# Patient Record
Sex: Male | Born: 1950 | Race: Black or African American | Hispanic: No
Health system: Southern US, Community
[De-identification: ages and names within clinical notes are randomized; demographics above are authoritative.]

## PROBLEM LIST (undated history)

## (undated) DIAGNOSIS — C61 Malignant neoplasm of prostate: Secondary | ICD-10-CM

## (undated) DIAGNOSIS — H9190 Unspecified hearing loss, unspecified ear: Secondary | ICD-10-CM

## (undated) DIAGNOSIS — R569 Unspecified convulsions: Secondary | ICD-10-CM

## (undated) HISTORY — PX: PROSTATE SURGERY: SHX751

---

## 2020-07-12 ENCOUNTER — Other Ambulatory Visit: Payer: Self-pay

## 2020-07-12 ENCOUNTER — Emergency Department (HOSPITAL_BASED_OUTPATIENT_CLINIC_OR_DEPARTMENT_OTHER): Payer: Medicare HMO

## 2020-07-12 ENCOUNTER — Emergency Department (HOSPITAL_BASED_OUTPATIENT_CLINIC_OR_DEPARTMENT_OTHER)
Admission: EM | Admit: 2020-07-12 | Discharge: 2020-07-12 | Disposition: A | Discharge status: Home / Self Care | Payer: Medicare HMO | Attending: Emergency Medicine | Admitting: Emergency Medicine

## 2020-07-12 ENCOUNTER — Encounter (HOSPITAL_BASED_OUTPATIENT_CLINIC_OR_DEPARTMENT_OTHER): Payer: Self-pay

## 2020-07-12 DIAGNOSIS — Z20822 Contact with and (suspected) exposure to covid-19: Secondary | ICD-10-CM | POA: Insufficient documentation

## 2020-07-12 DIAGNOSIS — Z8546 Personal history of malignant neoplasm of prostate: Secondary | ICD-10-CM | POA: Insufficient documentation

## 2020-07-12 DIAGNOSIS — R569 Unspecified convulsions: Secondary | ICD-10-CM | POA: Diagnosis present

## 2020-07-12 HISTORY — DX: Malignant neoplasm of prostate: C61

## 2020-07-12 HISTORY — DX: Unspecified hearing loss, unspecified ear: H91.90

## 2020-07-12 HISTORY — DX: Unspecified convulsions: R56.9

## 2020-07-12 LAB — BASIC METABOLIC PANEL
Anion gap: 9 (ref 5–15)
BUN: 15 mg/dL (ref 8–23)
CO2: 27 mmol/L (ref 22–32)
Calcium: 9.5 mg/dL (ref 8.9–10.3)
Chloride: 98 mmol/L (ref 98–111)
Creatinine, Ser: 0.84 mg/dL (ref 0.61–1.24)
GFR calc Af Amer: >60 mL/min (ref >60)
GFR calc non Af Amer: >60 mL/min (ref >60)
Glucose, Bld: 107 mg/dL — ABNORMAL HIGH (ref 70–99)
Potassium: 5.4 mmol/L — ABNORMAL HIGH (ref 3.5–5.1)
Sodium: 134 mmol/L — ABNORMAL LOW (ref 135–145)

## 2020-07-12 LAB — CBC WITH DIFFERENTIAL/PLATELET
Abs Immature Granulocytes: 0.05 10*3/uL (ref 0.00–0.07)
Basophils Absolute: 0 10*3/uL (ref 0.0–0.1)
Basophils Relative: 0 %
Eosinophils Absolute: 0 10*3/uL (ref 0.0–0.5)
Eosinophils Relative: 0 %
HCT: 36.2 % — ABNORMAL LOW (ref 39.0–52.0)
Hemoglobin: 11.9 g/dL — ABNORMAL LOW (ref 13.0–17.0)
Immature Granulocytes: 1 %
Lymphocytes Relative: 2 %
Lymphs Abs: 0.2 10*3/uL — ABNORMAL LOW (ref 0.7–4.0)
MCH: 31.6 pg (ref 26.0–34.0)
MCHC: 32.9 g/dL (ref 30.0–36.0)
MCV: 96 fL (ref 80.0–100.0)
Monocytes Absolute: 0.7 10*3/uL (ref 0.1–1.0)
Monocytes Relative: 8 %
Neutro Abs: 8.8 10*3/uL — ABNORMAL HIGH (ref 1.7–7.7)
Neutrophils Relative %: 89 %
Platelets: 240 10*3/uL (ref 150–400)
RBC: 3.77 MIL/uL — ABNORMAL LOW (ref 4.22–5.81)
RDW: 15 % (ref 11.5–15.5)
WBC: 9.8 10*3/uL (ref 4.0–10.5)
nRBC: 0 % (ref 0.0–0.2)

## 2020-07-12 LAB — TROPONIN I (HIGH SENSITIVITY)
Troponin I (High Sensitivity): 8 ng/L (ref <18)
Troponin I (High Sensitivity): 7 ng/L (ref <18)

## 2020-07-12 LAB — PHENYTOIN LEVEL, TOTAL: Phenytoin Lvl: <2.5 ug/mL — ABNORMAL LOW (ref 10.0–20.0)

## 2020-07-12 LAB — SARS CORONAVIRUS 2 BY RT PCR (HOSPITAL ORDER, PERFORMED IN ~~LOC~~ HOSPITAL LAB): SARS Coronavirus 2: NEGATIVE

## 2020-07-12 MED ORDER — LORAZEPAM 2 MG/ML IJ SOLN
2.0000 mg | Freq: Once | INTRAMUSCULAR | Status: AC
  Administered 2020-07-12: 2 mg via INTRAVENOUS
  Filled 2020-07-12: qty 1

## 2020-07-12 MED ORDER — OXCARBAZEPINE 300 MG PO TABS
600.0000 mg | ORAL_TABLET | Freq: Two times a day (BID) | ORAL | Status: DC
  Filled 2020-07-12: qty 2

## 2020-07-12 MED ORDER — SODIUM CHLORIDE 0.9 % IV SOLN
1000.0000 mg | Freq: Once | INTRAVENOUS | Status: AC
  Administered 2020-07-12: 1000 mg via INTRAVENOUS
  Filled 2020-07-12: qty 20

## 2020-07-12 MED ORDER — LAMOTRIGINE 100 MG PO TABS
100.0000 mg | ORAL_TABLET | Freq: Two times a day (BID) | ORAL | Status: DC
  Filled 2020-07-12: qty 1

## 2020-07-12 MED ORDER — SODIUM CHLORIDE 0.9 % IV SOLN
1000.0000 mg | Freq: Once | INTRAVENOUS | Status: DC
  Filled 2020-07-12: qty 20

## 2020-07-12 MED ORDER — SODIUM CHLORIDE 0.9 % IV SOLN
1500.0000 mg | Freq: Once | INTRAVENOUS | Status: DC
  Filled 2020-07-12: qty 30

## 2020-07-12 MED ORDER — PHENYTOIN SODIUM 50 MG/ML IJ SOLN
INTRAMUSCULAR | Status: AC
  Filled 2020-07-12: qty 16

## 2020-07-12 NOTE — ED Notes (Signed)
Notified [facility]) that pt will be discharged

## 2020-07-12 NOTE — ED Triage Notes (Addendum)
Pt's brother states pt ahd a seizure ~7am and has been disoriented-pt states and spells name-states "um" to other ?s asked-pt does answer some ?s when he brother repeats the ? I ask-NAD-to triage in w/c-brother called pt's girlfriend-she states that pt is normally "dumbfounded" after seizure but feels this period is lasting longer per than his normal per report from pt's siblings-pt is in town from [place] for [employer] funeral

## 2020-07-12 NOTE — ED Notes (Signed)
Pt on monitor 

## 2020-07-12 NOTE — ED Notes (Signed)
Pt states he does not want to be admitted and wants to go home. PA to bedside and discussed reasons for admission and risks for not being admitted including death. Pt verbalized understanding and wishes to leave AMA. AMA form signed. Pt's brother at bedside and agrees with pt's decision.

## 2020-07-12 NOTE — ED Notes (Signed)
Called to patient's room by his brother, Wilburn Cornelia, and noted that patient is trying to get out of bed.  Does not respond to  verbal stimuli.  Noted that patient is incontinent of urine.  EDP at bedside.

## 2020-07-12 NOTE — ED Provider Notes (Cosign Needed)
[facility] EMERGENCY DEPARTMENT Provider Note   CSN: [id number] Arrival date & time: 07/12/20  1215     History Chief Complaint  Patient presents with  . Seizures    Kenneth Saunders is a 69 y.o. male.  HPI   Patient is a 69 year old male with medical history as noted below.  He has a history of seizures and is currently taking 300 mg of oxcarbazepine twice daily, 100 mg of Lamictal twice daily, 100 mg of phenytoin twice daily.  I spoke to the patient's sister who states that patient woke up this morning and appeared to be disoriented.  She states that he did not seem to understand who he was or where he was at.  He is in town due to the death of his mother.  His sister contacted the patient's girlfriend back home and she states that this is typical of his "breakthrough seizures".  She states that he needed to take a nap and he should be fine.  Patient then took a nap and appeared to be normal.  The sister left the room and when coming back noticed that once again he appeared to be confused and was not clearly answering questions.  After a short period of time she determined that patient probably come to the emergency department for evaluation.  His brother states their mother died last week and he has been more stressed recently and not sleeping.  No one has witnessed the patient taking his medications but states that "his pillbox is empty".  Patient has no complaints at this time and states that "he feels fine".  He is alert and oriented x3.     Past Medical History:  Diagnosis Date  . HOH (hard of hearing)   . Prostate cancer ([place])   . Seizures ([place])     There are no problems to display for this patient.   Past Surgical History:  Procedure Laterality Date  . PROSTATE SURGERY         No family history on file.  Social History   Tobacco Use  . Smoking status: Not on file  Substance Use Topics  . Alcohol use: Not on file  . Drug use: Not on file    Home  Medications Prior to Admission medications   Not on File    Allergies    Patient has no known allergies.  Review of Systems   Review of Systems  All other systems reviewed and are negative. Ten systems reviewed and are negative for acute change, except as noted in the HPI.    Physical Exam Updated Vital Signs BP 124/80 (BP Location: Right Arm)   Pulse 81   Temp 97.6 F (36.4 C) (Oral)   Resp 17   SpO2 97%   Physical Exam Vitals and nursing note reviewed.  Constitutional:      General: He is not in acute distress.    Appearance: Normal appearance. He is not ill-appearing, toxic-appearing or diaphoretic.     Comments: Well developed elderly male sitting upright. He is hard of hearing but speaks clearly and answers questions appropriately.  HENT:     Head: Normocephalic and atraumatic.     Right Ear: External ear normal.     Left Ear: External ear normal.     Nose: Nose normal.     Mouth/Throat:     Mouth: Mucous membranes are moist.     Pharynx: Oropharynx is clear. No oropharyngeal exudate or posterior oropharyngeal erythema.  Eyes:  Extraocular Movements: Extraocular movements intact.  Cardiovascular:     Rate and Rhythm: Normal rate and regular rhythm.     Pulses: Normal pulses.     Heart sounds: Murmur heard.  No friction rub. No gallop.   Pulmonary:     Effort: Pulmonary effort is normal. No respiratory distress.     Breath sounds: Normal breath sounds. No stridor. No wheezing, rhonchi or rales.  Abdominal:     General: Abdomen is flat.     Tenderness: There is no abdominal tenderness.  Musculoskeletal:        General: Normal range of motion.     Cervical back: Normal range of motion and neck supple. No tenderness.  Skin:    General: Skin is warm and dry.  Neurological:     General: No focal deficit present.     Mental Status: He is alert and oriented to person, place, and time.     Comments: Patient is oriented to person, place, and time. Patient  phonates in clear, complete, and coherent sentences. Negative arm drift. Finger to nose intact bilaterally with no visible signs of dysmetria. Strength is 5/5 in all four extremities. Distal sensation intact in all four extremities.  Psychiatric:        Mood and Affect: Mood normal.        Behavior: Behavior normal.    ED Results / Procedures / Treatments   Labs (all labs ordered are listed, but only abnormal results are displayed) Labs Reviewed  PHENYTOIN LEVEL, TOTAL - Abnormal; Notable for the following components:      Result Value   Phenytoin Lvl <2.5 (*)    All other components within normal limits  CBC WITH DIFFERENTIAL/PLATELET - Abnormal; Notable for the following components:   RBC 3.77 (*)    Hemoglobin 11.9 (*)    HCT 36.2 (*)    Neutro Abs 8.8 (*)    Lymphs Abs 0.2 (*)    All other components within normal limits  BASIC METABOLIC PANEL - Abnormal; Notable for the following components:   Sodium 134 (*)    Potassium 5.4 (*)    Glucose, Bld 107 (*)    All other components within normal limits  SARS CORONAVIRUS 2 BY RT PCR (HOSPITAL ORDER, [facility])  LAMOTRIGINE LEVEL  TROPONIN I (HIGH SENSITIVITY)    EKG None  Radiology CT Head Wo Contrast  Result Date: 07/12/2020 CLINICAL DATA:  Recent seizure activity with altered mental status EXAM: CT HEAD WITHOUT CONTRAST TECHNIQUE: Contiguous axial images were obtained from the base of the skull through the vertex without intravenous contrast. COMPARISON:  None. FINDINGS: Brain: No evidence of acute infarction, hemorrhage, hydrocephalus, extra-axial collection or mass lesion/mass effect. Vascular: No hyperdense vessel or unexpected calcification. Skull: Normal. Negative for fracture or focal lesion. Sinuses/Orbits: No acute finding. Other: None. IMPRESSION: No acute intracranial abnormality noted. Electronically Signed   By: Inez Catalina M.D.   On: 07/12/2020 15:44   Procedures Procedures    Medications Ordered in ED Medications  lamoTRIgine (LAMICTAL) tablet 100 mg (has no administration in time range)  Oxcarbazepine (TRILEPTAL) tablet 600 mg (has no administration in time range)  phenytoin (DILANTIN) 50 MG/ML injection (has no administration in time range)  phenytoin (DILANTIN) 1,000 mg in sodium chloride 0.9 % 250 mL IVPB (1,000 mg Intravenous New Bag/Given 07/12/20 1607)  LORazepam (ATIVAN) injection 2 mg (2 mg Intravenous Given 07/12/20 1502)   ED Course  I have reviewed the triage vital  signs and the nursing notes.  Pertinent labs & imaging results that were available during my care of the patient were reviewed by me and considered in my medical decision making (see chart for details).  Clinical Course as of Jul 12 1612  Wed Jul 12, 2020  1437 Obtain basic labs, phenytoin level, CT of his head.  Will reassess.   [LJ]  8127 Myself and the attending physician were called in the patient's room.  Patient's brother states that he became erratic and started "angrily" pulling off the cardiac monitor from his chest.  He then had an episode of bladder incontinence.  He appears to be postictal at this time and is not clearly answering questions.  Will give 2 mg of IV Ativan.  We will continue to closely monitor.  Patient will likely be admitted for observation.   [LJ]  1513 No prior ECG to compare to and his ECG today shows RSR in V1 or V2.  There is LVH with secondary repolarization abnormality.  There is anterior ST elevation which is probably due to his LVH.  Will additionally obtain a troponin.    [LJ]  1518 Potassium(!): 5.4 [LJ]  1519 Likely reactive 2/2 sz  NEUT#(!): 8.8 [LJ]  1535 Discussed with my attending physician Dr. Virgel Manifold.  We have ordered a loading dose of phenytoin.  Will order remaining home medications.  Will discuss with neurology.  Phenytoin Lvl(!): <2.5 [LJ]  5170 My attending physician discussed patient with Dr. Leonel Ramsay with neurology. Pt will be  admitted.  COVID-19 test ordered.   [LJ]    Clinical Course User Index [LJ] Rayna Sexton, PA-C   MDM Rules/Calculators/A&P                          Pt is a 69 y.o. male that present with a history, physical exam, ED Clinical Course as noted above.   Patient presents today due to what appears to be multiple seizures.  Family is unsure if patient has been compliant with his medications but states that "his pillboxes are empty".  I obtained a Dilantin level which was less than 2.5.  Patient was discussed with my attending physician and he was given a loading dose of Dilantin.  Additionally ordered his home Lamictal as well as oxcarbazepine.  Patient was discussed with Dr. Leonel Ramsay with neurology and will be admitted for further observation.  This plan was discussed with patient and his brother is at bedside.  Patient resides in [place] and is only in town for his mother's funeral.  They are amenable with the above plan.  COVID-19 test ordered.  Note: Portions of this report may have been transcribed using voice recognition software. Every effort was made to ensure accuracy; however, inadvertent computerized transcription errors may be present.   Final Clinical Impression(s) / ED Diagnoses Final diagnoses:  Seizure [facility])   Rx / DC Orders ED Discharge Orders    None       Rayna Sexton, PA-C 07/12/20 1615

## 2020-07-12 NOTE — ED Notes (Signed)
ED Provider at bedside. 

## 2020-07-13 LAB — LAMOTRIGINE LEVEL: Lamotrigine Lvl: 3.8 ug/mL (ref 2.0–20.0)

## 2022-02-08 IMAGING — CT CT HEAD W/O CM
3 series · 15 of 47 positions shown, 18 images · non-contrast
Comparison: None.

CLINICAL DATA: Recent seizure activity with altered mental status

EXAM:
CT HEAD WITHOUT CONTRAST
TECHNIQUE: Contiguous axial images were obtained from the base of the skull
through the vertex without intravenous contrast.

[Series 2: head wo · axial · 0.45mm/px · z∈[-162,-37]mm · 9 of 31 slices shown, 12 images]
[im 3/31  brain]
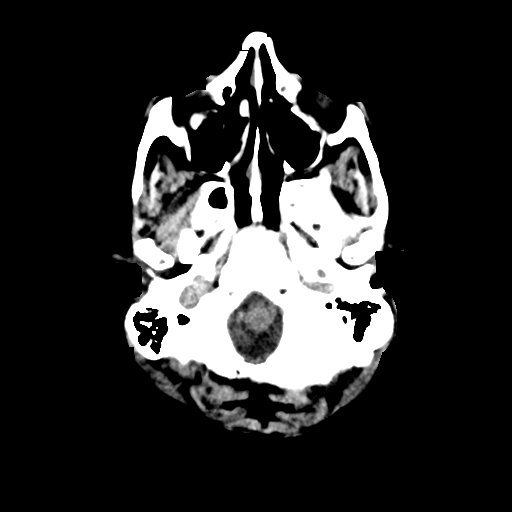
[im 3/31  bone]
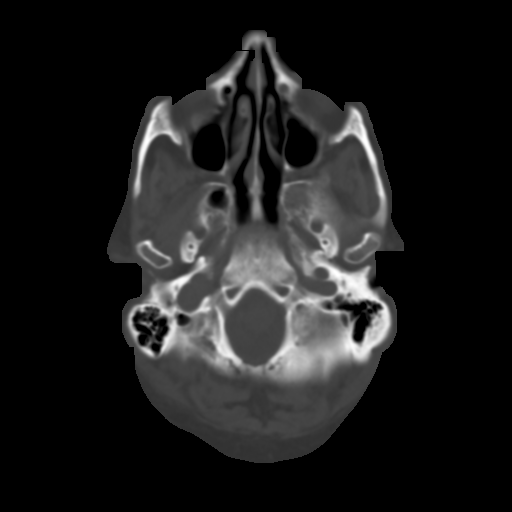
[im 6/31  brain]
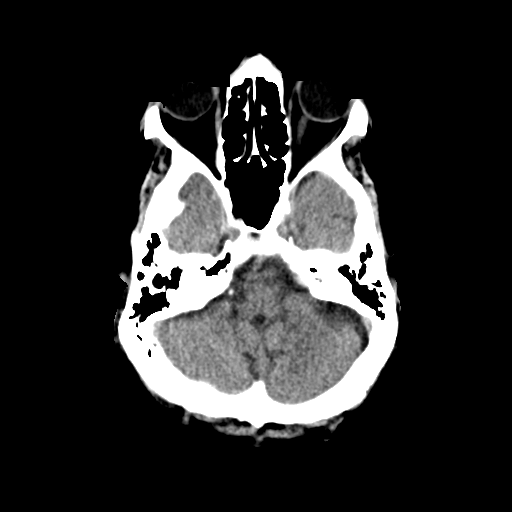
[im 9/31  brain]
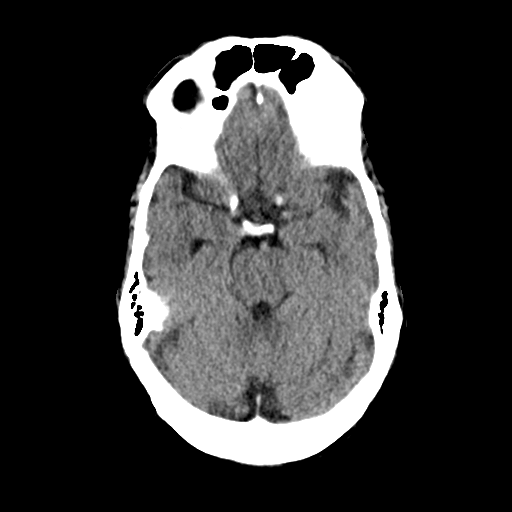
[im 12/31  brain]
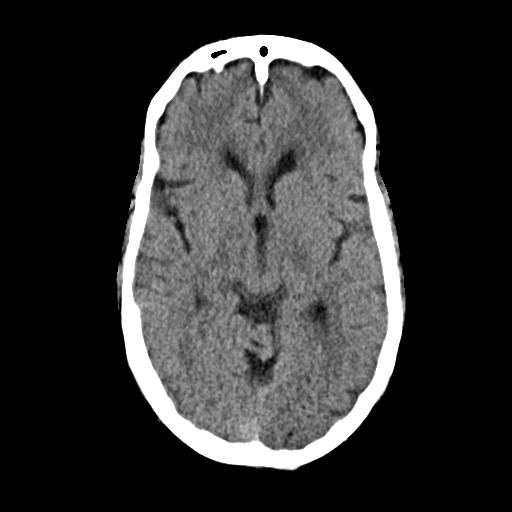
[im 16/31  brain]
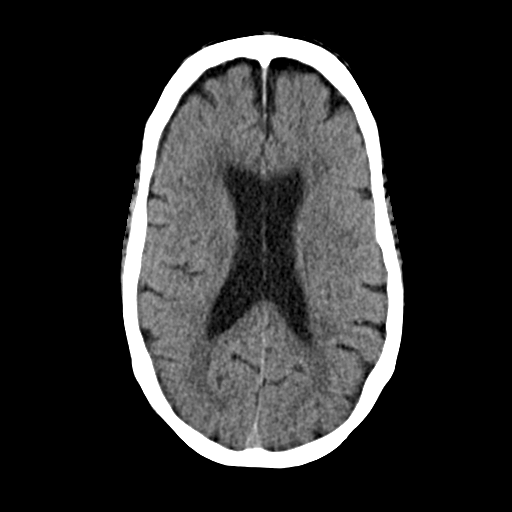
[im 16/31  bone]
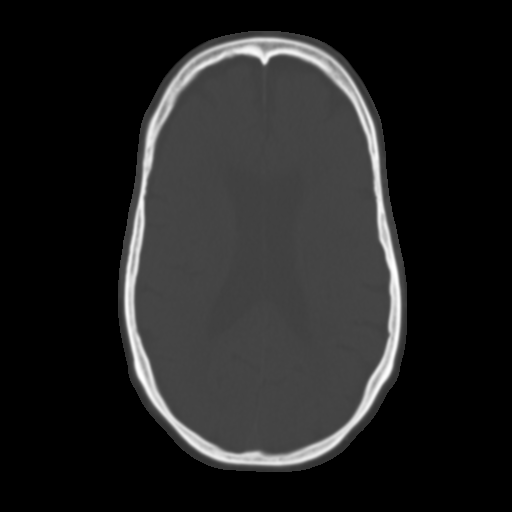
[im 19/31  brain]
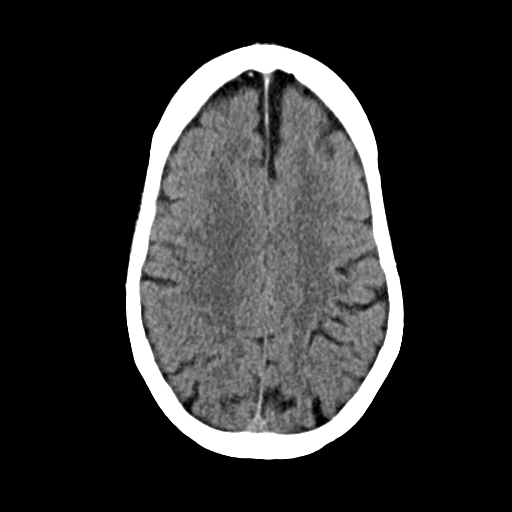
[im 22/31  brain]
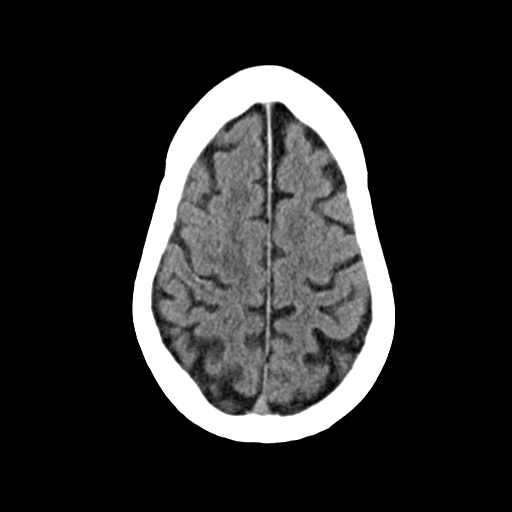
[im 25/31  brain]
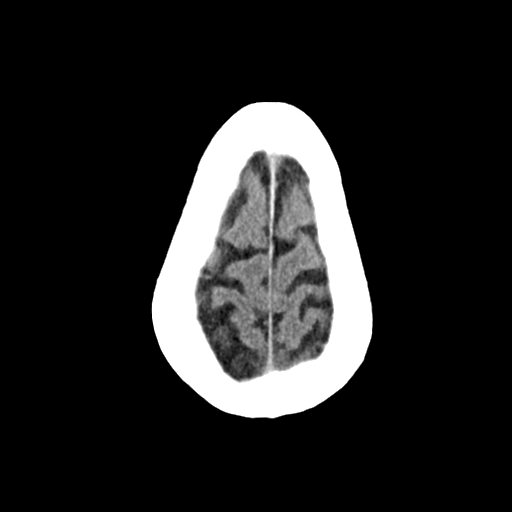
[im 28/31  brain]
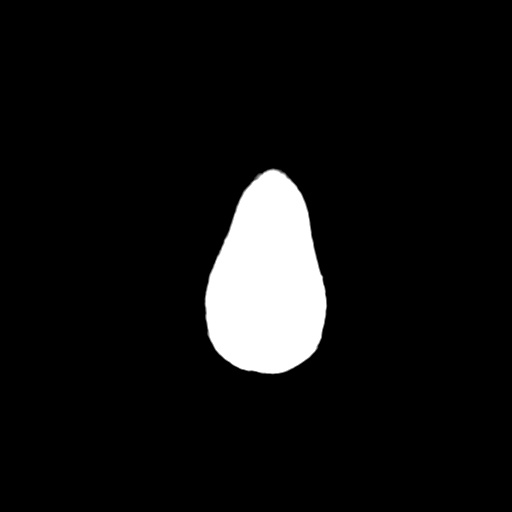
[im 28/31  bone]
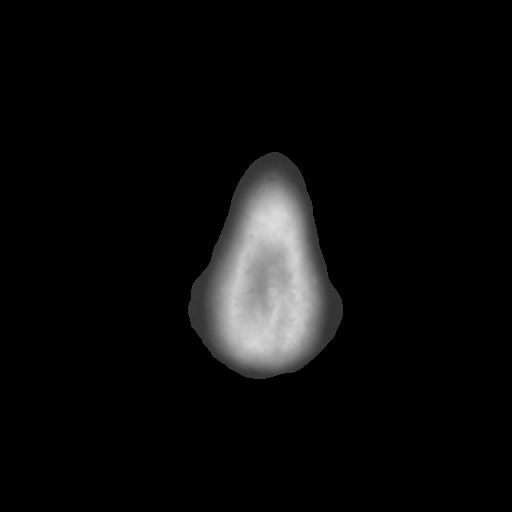

[Series 4: coronal soft · coronal · 0.31mm/px · 3 of 71 slices shown]
[im 24/71  brain]
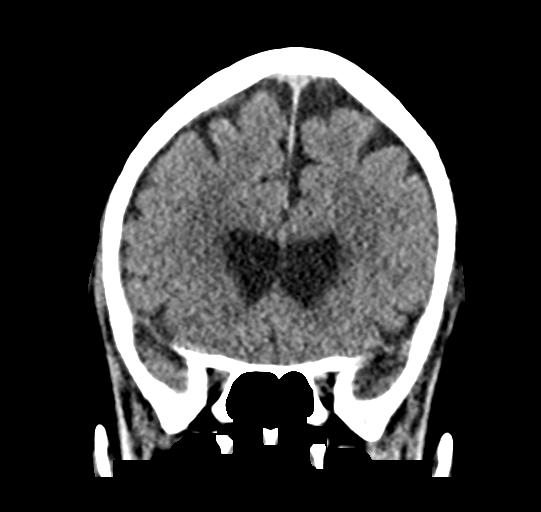
[im 32/71  brain]
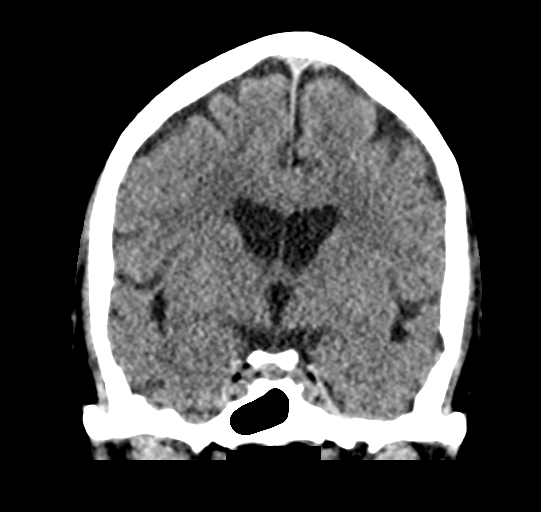
[im 39/71  brain]
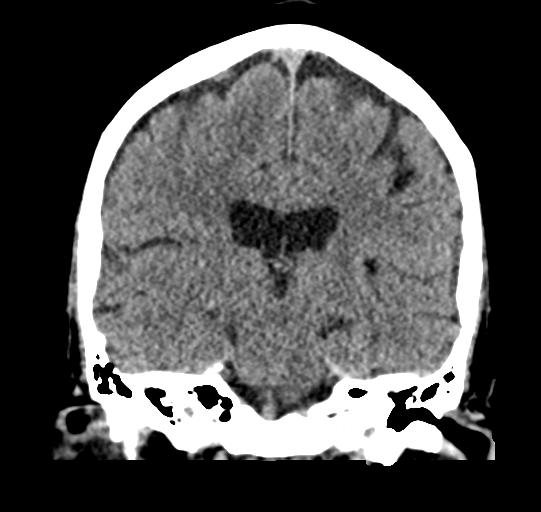

[Series 5: sag soft · sagittal · 0.32mm/px · 3 of 51 slices shown]
[im 17/51  brain]
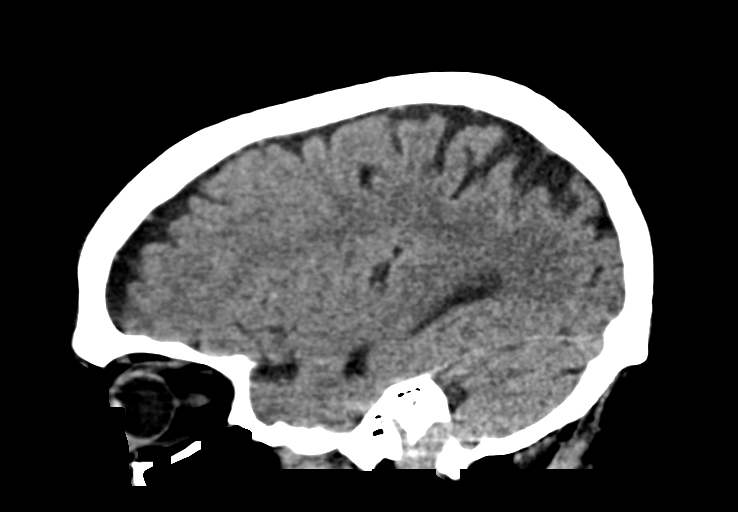
[im 26/51  brain]
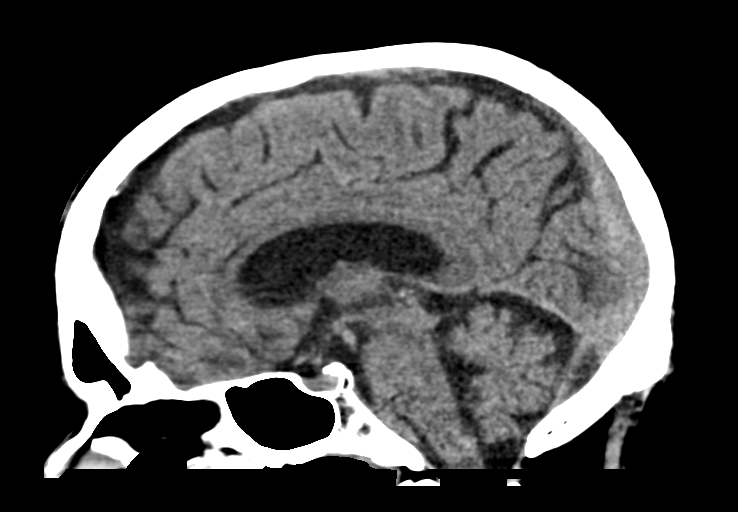
[im 34/51  brain]
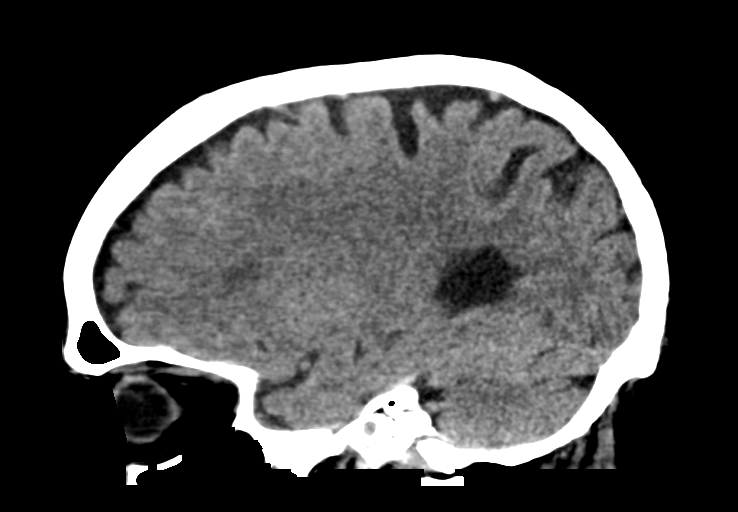

[15 of 47 positions shown; findings below may reference images not displayed]

FINDINGS: Brain: No evidence of acute infarction, hemorrhage, hydrocephalus,
extra-axial collection or mass lesion/mass effect.

Vascular: No hyperdense vessel or unexpected calcification.

Skull: Normal. Negative for fracture or focal lesion.

Sinuses/Orbits: No acute finding.

Other: None.
IMPRESSION: No acute intracranial abnormality noted.
